# Patient Record
Sex: Male | Born: 1960 | Hispanic: Refuse to answer | Marital: Married | Smoking: Current some day smoker
Health system: Southern US, Community
[De-identification: ages and names within clinical notes are randomized; demographics above are authoritative.]

## PROBLEM LIST (undated history)

## (undated) DIAGNOSIS — I10 Essential (primary) hypertension: Secondary | ICD-10-CM

## (undated) HISTORY — PX: SKIN BIOPSY: SHX1

---

## 2017-02-16 ENCOUNTER — Emergency Department (HOSPITAL_COMMUNITY)
Admission: EM | Admit: 2017-02-16 | Discharge: 2017-02-16 | Disposition: A | Discharge status: Home / Self Care | Payer: BLUE CROSS/BLUE SHIELD | Attending: Emergency Medicine | Admitting: Emergency Medicine

## 2017-02-16 ENCOUNTER — Encounter (HOSPITAL_COMMUNITY): Payer: Self-pay | Admitting: Emergency Medicine

## 2017-02-16 ENCOUNTER — Emergency Department (HOSPITAL_COMMUNITY): Payer: BLUE CROSS/BLUE SHIELD

## 2017-02-16 DIAGNOSIS — R112 Nausea with vomiting, unspecified: Secondary | ICD-10-CM | POA: Insufficient documentation

## 2017-02-16 DIAGNOSIS — R6883 Chills (without fever): Secondary | ICD-10-CM | POA: Diagnosis not present

## 2017-02-16 DIAGNOSIS — R5383 Other fatigue: Secondary | ICD-10-CM | POA: Diagnosis not present

## 2017-02-16 DIAGNOSIS — R197 Diarrhea, unspecified: Secondary | ICD-10-CM | POA: Insufficient documentation

## 2017-02-16 DIAGNOSIS — Z79899 Other long term (current) drug therapy: Secondary | ICD-10-CM | POA: Diagnosis not present

## 2017-02-16 DIAGNOSIS — E860 Dehydration: Secondary | ICD-10-CM

## 2017-02-16 DIAGNOSIS — I10 Essential (primary) hypertension: Secondary | ICD-10-CM | POA: Insufficient documentation

## 2017-02-16 DIAGNOSIS — R42 Dizziness and giddiness: Secondary | ICD-10-CM | POA: Diagnosis not present

## 2017-02-16 DIAGNOSIS — R531 Weakness: Secondary | ICD-10-CM | POA: Diagnosis not present

## 2017-02-16 DIAGNOSIS — F17290 Nicotine dependence, other tobacco product, uncomplicated: Secondary | ICD-10-CM | POA: Insufficient documentation

## 2017-02-16 DIAGNOSIS — R61 Generalized hyperhidrosis: Secondary | ICD-10-CM | POA: Diagnosis not present

## 2017-02-16 HISTORY — DX: Essential (primary) hypertension: I10

## 2017-02-16 LAB — COMPREHENSIVE METABOLIC PANEL
ALBUMIN: 4.5 g/dL (ref 3.5–5.0)
ALT: 33 U/L (ref 17–63)
ANION GAP: 14 (ref 5–15)
AST: 64 U/L — ABNORMAL HIGH (ref 15–41)
Alkaline Phosphatase: 44 U/L (ref 38–126)
BILIRUBIN TOTAL: 1.4 mg/dL — AB (ref 0.3–1.2)
BUN: 15 mg/dL (ref 6–20)
CO2: 23 mmol/L (ref 22–32)
Calcium: 10 mg/dL (ref 8.9–10.3)
Chloride: 103 mmol/L (ref 101–111)
Creatinine, Ser: 1.56 mg/dL — ABNORMAL HIGH (ref 0.61–1.24)
GFR calc Af Amer: 56 mL/min — ABNORMAL LOW (ref >60)
GFR calc non Af Amer: 48 mL/min — ABNORMAL LOW (ref >60)
GLUCOSE: 137 mg/dL — AB (ref 65–99)
Potassium: 4 mmol/L (ref 3.5–5.1)
Sodium: 140 mmol/L (ref 135–145)
TOTAL PROTEIN: 7.7 g/dL (ref 6.5–8.1)

## 2017-02-16 LAB — I-STAT CG4 LACTIC ACID, ED
LACTIC ACID, VENOUS: 2.54 mmol/L — AB (ref 0.5–1.9)
Lactic Acid, Venous: 1.78 mmol/L (ref 0.5–1.9)

## 2017-02-16 LAB — URINALYSIS, ROUTINE W REFLEX MICROSCOPIC
BACTERIA UA: NONE SEEN
Squamous Epithelial / LPF: NONE SEEN

## 2017-02-16 LAB — CBC
HEMATOCRIT: 36.1 % — AB (ref 39.0–52.0)
HEMOGLOBIN: 12.1 g/dL — AB (ref 13.0–17.0)
MCH: 31.6 pg (ref 26.0–34.0)
MCHC: 33.5 g/dL (ref 30.0–36.0)
MCV: 94.3 fL (ref 78.0–100.0)
Platelets: 149 10*3/uL — ABNORMAL LOW (ref 150–400)
RBC: 3.83 MIL/uL — AB (ref 4.22–5.81)
RDW: 13 % (ref 11.5–15.5)
WBC: 5.3 10*3/uL (ref 4.0–10.5)

## 2017-02-16 LAB — ETHANOL: Alcohol, Ethyl (B): <5 mg/dL (ref <5)

## 2017-02-16 LAB — LIPASE, BLOOD: Lipase: 37 U/L (ref 11–51)

## 2017-02-16 LAB — I-STAT TROPONIN, ED: Troponin i, poc: 0 ng/mL (ref 0.00–0.08)

## 2017-02-16 MED ORDER — ONDANSETRON HCL 4 MG/2ML IJ SOLN
4.0000 mg | Freq: Once | INTRAMUSCULAR | Status: AC | PRN
  Administered 2017-02-16: 4 mg via INTRAVENOUS

## 2017-02-16 MED ORDER — IOPAMIDOL (ISOVUE-300) INJECTION 61%
INTRAVENOUS | Status: AC
  Filled 2017-02-16: qty 100

## 2017-02-16 MED ORDER — LORAZEPAM 2 MG/ML IJ SOLN
1.0000 mg | Freq: Once | INTRAMUSCULAR | Status: AC
  Administered 2017-02-16: 1 mg via INTRAVENOUS
  Filled 2017-02-16 (×2): qty 1

## 2017-02-16 MED ORDER — SODIUM CHLORIDE 0.9 % IV BOLUS (SEPSIS)
1000.0000 mL | Freq: Once | INTRAVENOUS | Status: AC
  Administered 2017-02-16: 1000 mL via INTRAVENOUS

## 2017-02-16 MED ORDER — IOPAMIDOL (ISOVUE-300) INJECTION 61%
INTRAVENOUS | Status: AC
  Administered 2017-02-16: 100 mL
  Filled 2017-02-16: qty 30

## 2017-02-16 MED ORDER — ONDANSETRON 4 MG PO TBDP: 4.0000 mg | ORAL_TABLET | Freq: Three times a day (TID) | ORAL | 0 refills | Status: AC | PRN

## 2017-02-16 MED ORDER — CEPHALEXIN 500 MG PO CAPS: 500.0000 mg | ORAL_CAPSULE | Freq: Three times a day (TID) | ORAL | 0 refills | Status: AC

## 2017-02-16 MED ORDER — HYDROCODONE-ACETAMINOPHEN 5-325 MG PO TABS: 1.0000 | ORAL_TABLET | Freq: Four times a day (QID) | ORAL | 0 refills | Status: AC | PRN

## 2017-02-16 MED ORDER — ONDANSETRON HCL 4 MG/2ML IJ SOLN
INTRAMUSCULAR | Status: AC
  Filled 2017-02-16: qty 2

## 2017-02-16 MED ORDER — ONDANSETRON HCL 4 MG/2ML IJ SOLN: 4.0000 mg | Freq: Once | INTRAMUSCULAR | Status: DC

## 2017-02-16 NOTE — ED Triage Notes (Addendum)
Pt from urologist following a prostate biopsy. Pt has had emesis, abdominal pain, and pain from the biopsy. Pt rates his pain at 10/10. Pt states he has been able to urinate following the biopsy and had a hematuria  (which he was told to expect). Pt has had 4 episodes of emesis following this. Pt had 25 mg phenergan IV. Pt states they did not give him pain medication

## 2017-02-16 NOTE — ED Notes (Signed)
Pt given ginger ale.

## 2017-02-16 NOTE — ED Provider Notes (Signed)
[facility]-EMERGENCY DEPT Provider Note   CSN: [id number] Arrival date & time: 02/16/17  1516     History   Chief Complaint Chief Complaint  Patient presents with  . Emesis    HPI Arthur Hardin is a 56 y.o. male.  HPI  56 yo M with PMHx HTN here with nausea, vomiting after prostate procedure. Pt actually states he awoke this AM feeling nauseous, lightheaded. He has not had anything to eat or drink. He thought this was due to nerves. He went to have a prostate bx in the office today with Dr. Wilson Singer when he began to feel nauseous, sweaty, and more lightheaded. The procedure was initially delayed, then completed and pt began to feel mildly worse. He had nausea, vomiting after the procedure that slowly improved so he was going to return home. While waiting for his car, he had recurrence of his nausea, vomiting with profuse sweating. No chest pain or abdominal pain. He subsequently was brought to ED for evaluation as he lives one hour away and was concerned about returning home. No fevers.   Past Medical History:  Diagnosis Date  . Hypertension     There are no active problems to display for this patient.   Past Surgical History:  Procedure Laterality Date  . SKIN BIOPSY         Home Medications    Prior to Admission medications   Medication Sig Start Date End Date Taking? Authorizing Provider  acetaminophen (TYLENOL) 500 MG tablet Take 1,000 mg by mouth every 6 (six) hours as needed.   Yes [provider]  nebivolol (BYSTOLIC) 10 MG tablet Take 20 mg by mouth daily.   Yes [provider]  cephALEXin (KEFLEX) 500 MG capsule Take 1 capsule (500 mg total) by mouth 3 (three) times daily. 02/16/17 02/21/17  Shaune Pollack, MD  HYDROcodone-acetaminophen (NORCO/VICODIN) 5-325 MG tablet Take 1-2 tablets by mouth every 6 (six) hours as needed for moderate pain or severe pain. 02/16/17   Shaune Pollack, MD  ondansetron (ZOFRAN ODT) 4 MG disintegrating tablet Take 1 tablet (4  mg total) by mouth every 8 (eight) hours as needed for nausea or vomiting. 02/16/17   Shaune Pollack, MD    Family History No family history on file.  Social History Social History  Substance Use Topics  . Smoking status: Current Some Day Smoker    Types: Cigars  . Smokeless tobacco: Never Used     Comment: rarely   . Alcohol use Yes     Comment: rarely      Allergies   Patient has no known allergies.   Review of Systems Review of Systems  Constitutional: Positive for chills and fatigue.  Gastrointestinal: Positive for nausea and vomiting.  Neurological: Positive for weakness and light-headedness.  All other systems reviewed and are negative.    Physical Exam Updated Vital Signs BP (!) 144/103 (BP Location: Right Arm)   Pulse (!) 106   Temp 98.4 F (36.9 C) (Oral)   Resp 18   Ht 5\' 8"  (1.727 m)   Wt 70.8 kg (156 lb)   SpO2 99%   BMI 23.72 kg/m   Physical Exam  Constitutional: He is oriented to person, place, and time. He appears well-developed and well-nourished. No distress.  HENT:  Head: Normocephalic and atraumatic.  Dry mucous membranes  Eyes: Conjunctivae are normal.  Neck: Neck supple.  Cardiovascular: Normal rate, regular rhythm and normal heart sounds.  Exam reveals no friction rub.   No murmur heard.  Pulmonary/Chest: Effort normal and breath sounds normal. No respiratory distress. He has no wheezes. He has no rales.  Abdominal: Soft. He exhibits no distension. There is no tenderness. There is no rebound and no guarding.  Hyperactive bowel sounds  Genitourinary:  Genitourinary Comments: No gross hematuria or bleeding from urethra.   Musculoskeletal: He exhibits no edema.  Neurological: He is alert and oriented to person, place, and time. He exhibits normal muscle tone.  Skin: Skin is warm. Capillary refill takes less than 2 seconds.  Psychiatric: He has a normal mood and affect.  Nursing note and vitals reviewed.    ED Treatments / Results    Labs (all labs ordered are listed, but only abnormal results are displayed) Labs Reviewed  COMPREHENSIVE METABOLIC PANEL - Abnormal; Notable for the following:       Result Value   Glucose, Bld 137 (*)    Creatinine, Ser 1.56 (*)    AST 64 (*)    Total Bilirubin 1.4 (*)    GFR calc non Af Amer 48 (*)    GFR calc Af Amer 56 (*)    All other components within normal limits  CBC - Abnormal; Notable for the following:    RBC 3.83 (*)    Hemoglobin 12.1 (*)    HCT 36.1 (*)    Platelets 149 (*)    All other components within normal limits  URINALYSIS, ROUTINE W REFLEX MICROSCOPIC - Abnormal; Notable for the following:    Color, Urine RED (*)    APPearance TURBID (*)    Glucose, UA   (*)    Value: TEST NOT REPORTED DUE TO COLOR INTERFERENCE OF URINE PIGMENT   Hgb urine dipstick   (*)    Value: TEST NOT REPORTED DUE TO COLOR INTERFERENCE OF URINE PIGMENT   Bilirubin Urine   (*)    Value: TEST NOT REPORTED DUE TO COLOR INTERFERENCE OF URINE PIGMENT   Ketones, ur   (*)    Value: TEST NOT REPORTED DUE TO COLOR INTERFERENCE OF URINE PIGMENT   Protein, ur   (*)    Value: TEST NOT REPORTED DUE TO COLOR INTERFERENCE OF URINE PIGMENT   Nitrite   (*)    Value: TEST NOT REPORTED DUE TO COLOR INTERFERENCE OF URINE PIGMENT   Leukocytes, UA   (*)    Value: TEST NOT REPORTED DUE TO COLOR INTERFERENCE OF URINE PIGMENT   All other components within normal limits  I-STAT CG4 LACTIC ACID, ED - Abnormal; Notable for the following:    Lactic Acid, Venous 2.54 (*)    All other components within normal limits  LIPASE, BLOOD  ETHANOL  I-STAT TROPOININ, ED  I-STAT CG4 LACTIC ACID, ED    EKG  EKG Interpretation  Date/Time:  Wednesday February 16 2017 17:01:22 EDT Ventricular Rate:  98 PR Interval:    QRS Duration: 89 QT Interval:  371 QTC Calculation: 474 R Axis:   79 Text Interpretation:  Sinus rhythm No old tracing to compare Confirmed by Shaune PollackIsaacs, Eilam [place] [phone]([id number]) on 02/17/2017 2:11:23 AM        Radiology Ct Abdomen Pelvis W Contrast  Result Date: 02/16/2017 CLINICAL DATA:  Emesis and abdominal pain from recent prostate biopsy EXAM: CT ABDOMEN AND PELVIS WITH CONTRAST TECHNIQUE: Multidetector CT imaging of the abdomen and pelvis was performed using the standard protocol following bolus administration of intravenous contrast. CONTRAST:  100mL ISOVUE-300 IOPAMIDOL (ISOVUE-300) INJECTION 61% COMPARISON:  None. FINDINGS: Lower chest: Lung bases demonstrate focal scarring and  bronchiectasis in the subpleural right lower lobe medially. Heart size is normal. Hepatobiliary: Hepatic steatosis. No calcified gallstones or biliary dilatation Pancreas: Unremarkable. No pancreatic ductal dilatation or surrounding inflammatory changes. Spleen: Normal in size without focal abnormality. Adrenals/Urinary Tract: Adrenal glands are normal. subcentimeter hypodense lesion left kidney too small to characterize. Punctate nonobstructing stone lower pole left kidney. Bladder unremarkable Stomach/Bowel: Stomach is within normal limits. Appendix appears normal. No evidence of bowel wall thickening, distention, or inflammatory changes. Vascular/Lymphatic: Aortic atherosclerosis. No enlarged abdominal or pelvic lymph nodes. Reproductive: Slightly enlarged prostate with mass effect on the bladder posteriorly. Other: Bilobate circumscribed fluid collections in the posterior pelvis without significant enhancement. These are in close proximity to the rectum. No free air. Musculoskeletal: Mixed sclerosis and lucency in the sub articular left femoral head suspicious for AVN. Small nonspecific lucencies in the left iliac bone. IMPRESSION: 1. No definite acute abnormalities are seen 2. Slightly unusual circumscribed fluid collections in the posterior pelvis, but without rim enhancement to suggest abscess. Findings could be secondary to post biopsy fluid or possible perirectal cysts. 3. Hepatic steatosis 4. 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 Electronically Signed   By: Jasmine Pang M.D.   On: 02/16/2017 21:07    Procedures Procedures (including critical care time)  Medications Ordered in ED Medications  ondansetron (ZOFRAN) injection 4 mg (4 mg Intravenous Given 02/16/17 1636)  sodium chloride 0.9 % bolus 1,000 mL (0 mLs Intravenous Stopped 02/16/17 1902)  sodium chloride 0.9 % bolus 1,000 mL (0 mLs Intravenous Stopped 02/16/17 1815)  iopamidol (ISOVUE-300) 61 % injection (100 mLs  Contrast Given 02/16/17 2026)  LORazepam (ATIVAN) injection 1 mg (1 mg Intravenous Given 02/16/17 2230)     Initial Impression / Assessment and Plan / ED Course  I have reviewed the triage vital signs and the nursing notes.  Pertinent labs & imaging results that were available during my care of the patient were reviewed by me and considered in my medical decision making (see chart for details).    56 yo M with PMHx EtOH dependence here with nausea, vomiting in setting of recent prostate bx. I suspect pt likely has viral GI illness, food-borne illness, versus diarrhea/gastritis 2/2 recent heavy EtOH use, as his sx started before prostate biopsy. I suspect his sx worsened in setting of prostate bx but are not directly related. Lab work shows significant dehydration with mild LA elevation, which I suspect is 2/2 his vomiting and recent EtOH use, and he has no fever, normal WBC, and no evidence to suggest infectious etiology. AST:ALT elevated in pattern c/w chronic EtOH use. CT scan obtained given his recent biopsy and lactic elevation and fortunately shows no acute complications or other abnormalities. His orthostatics and LA have markedly improved after fluids and his sx are now resolved and he is feeling well, tolerating liquid and solid PO without difficulty. He is hypertensive which I suspect is his baseline (he admits to being>180/100 at home), though he may have some component of early, mild EtOH w/d. Pt given ativan with improvement, and  feels safe/stable to return home. I discussed diagnosis, labs, with pt in detail and he feels comfortable with plan. He has some mild pyuria which is likely inflammation s/p bx, but given his initial presentation and recent invasive procedure will place on keflex, d/c home with supportive care.  Final Clinical Impressions(s) / ED Diagnoses   Final diagnoses:  Nausea vomiting and diarrhea  Dehydration    New Prescriptions Discharge Medication List  as of 02/16/2017 10:26 PM    START taking these medications   Details  cephALEXin (KEFLEX) 500 MG capsule Take 1 capsule (500 mg total) by mouth 3 (three) times daily., Starting Wed 02/16/2017, Until Mon 02/21/2017, Print    HYDROcodone-acetaminophen (NORCO/VICODIN) 5-325 MG tablet Take 1-2 tablets by mouth every 6 (six) hours as needed for moderate pain or severe pain., Starting Wed 02/16/2017, Print    ondansetron (ZOFRAN ODT) 4 MG disintegrating tablet Take 1 tablet (4 mg total) by mouth every 8 (eight) hours as needed for nausea or vomiting., Starting Wed 02/16/2017, Print         Shaune Pollack, MD 02/17/17 [phone]

## 2017-02-16 NOTE — ED Notes (Signed)
Pt able to keep ginger ale down 

## 2017-03-07 ENCOUNTER — Encounter: Payer: Self-pay | Admitting: Radiation Oncology

## 2017-03-21 ENCOUNTER — Ambulatory Visit: Payer: BLUE CROSS/BLUE SHIELD | Admitting: Radiation Oncology

## 2017-03-21 ENCOUNTER — Ambulatory Visit: Payer: BLUE CROSS/BLUE SHIELD

## 2019-01-01 IMAGING — CT CT ABD-PELV W/ CM
2 of 5 series · 16 of 46 positions shown, 18 images · IV contrast (ISOVUE)
Comparison: None.

CLINICAL DATA: Emesis and abdominal pain from recent prostate
biopsy

EXAM:
CT ABDOMEN AND PELVIS WITH CONTRAST
TECHNIQUE: Multidetector CT imaging of the abdomen and pelvis was performed
using the standard protocol following bolus administration of
intravenous contrast.
CONTRAST:  100mL 0QCWIY-0ZZ IOPAMIDOL (0QCWIY-0ZZ) INJECTION 61%

[Series 2: abd/pel with · axial · 0.74mm/px · z∈[-448,-43]mm · 13 of 95 slices shown, 15 images]
[im 7/95  soft-tissue]
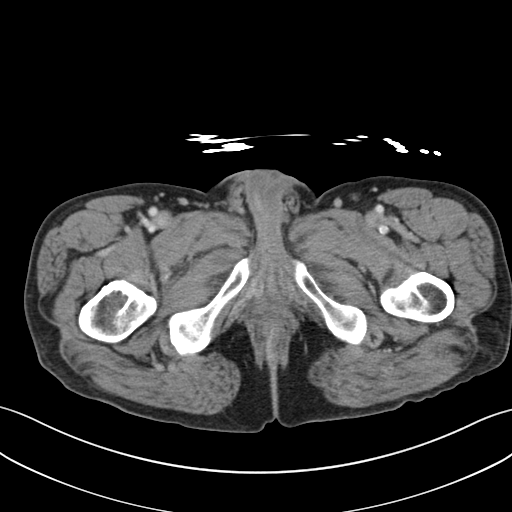
[im 7/95  bone]
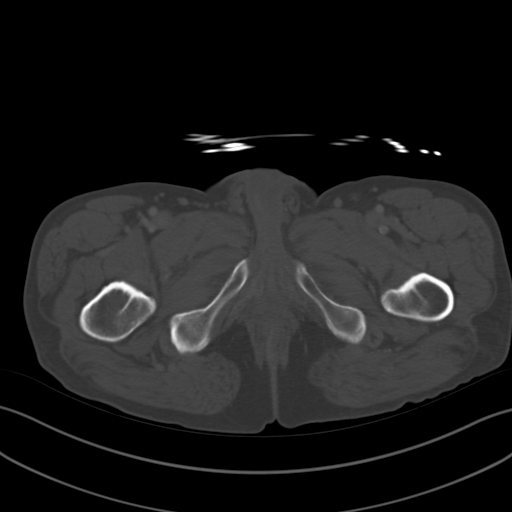
[im 14/95  soft-tissue]
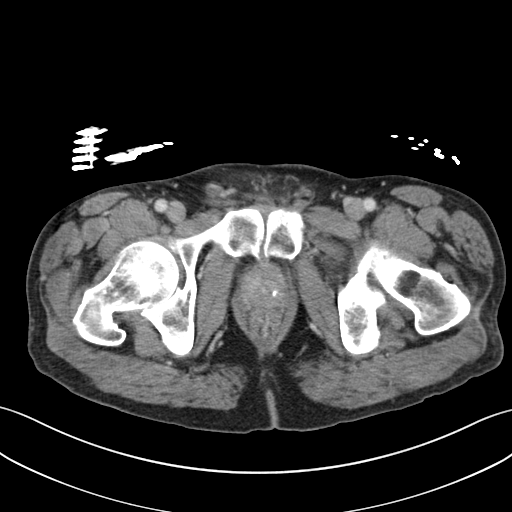
[im 21/95  soft-tissue]
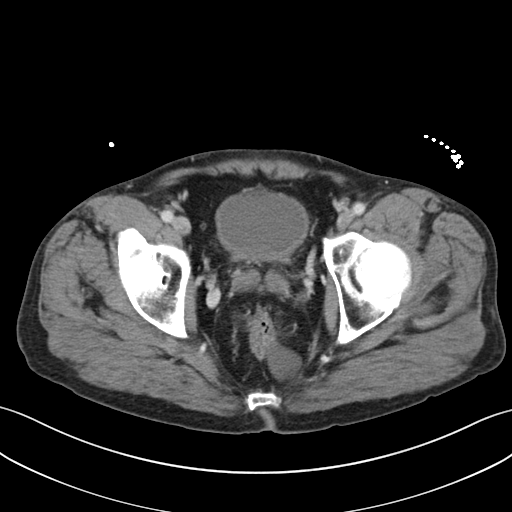
[im 27/95  soft-tissue]
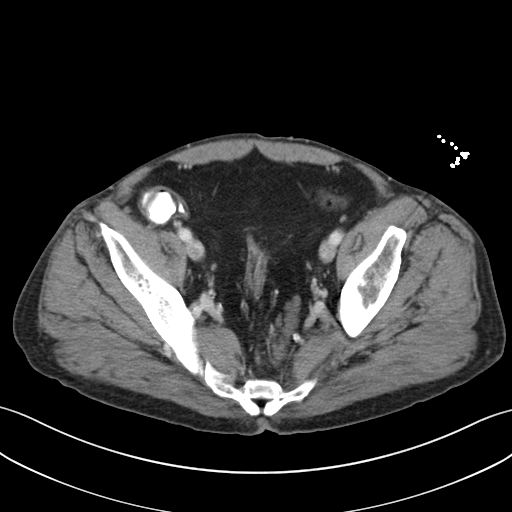
[im 34/95  soft-tissue]
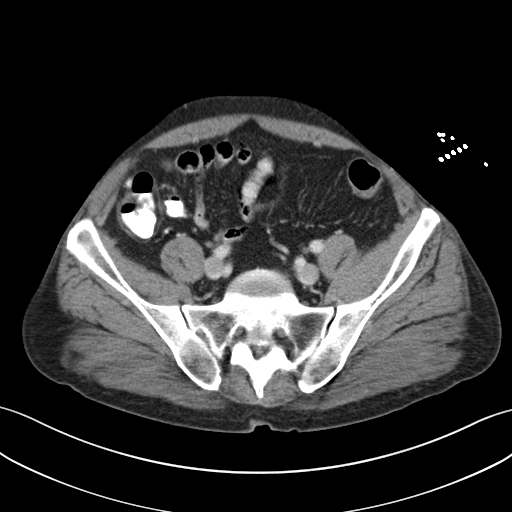
[im 41/95  soft-tissue]
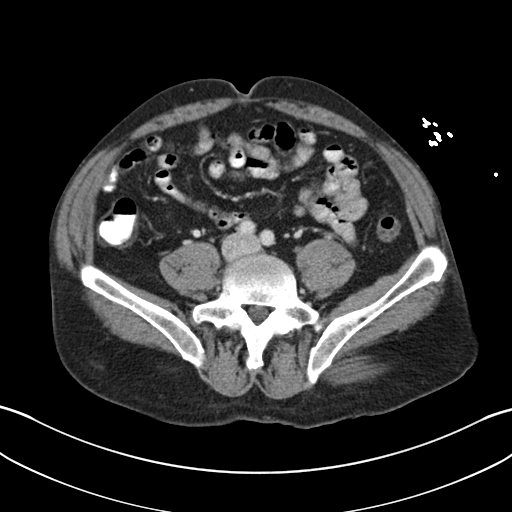
[im 48/95  soft-tissue]
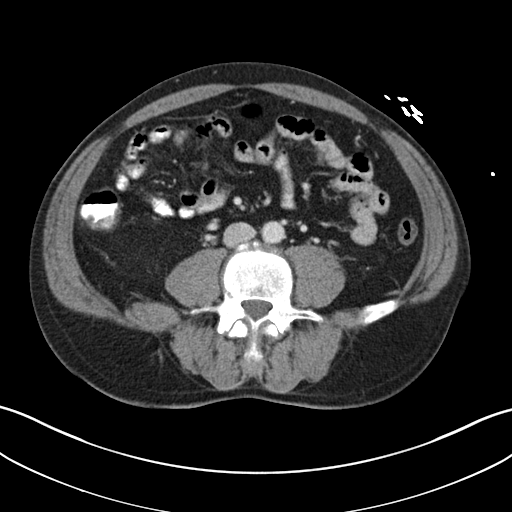
[im 54/95  soft-tissue]
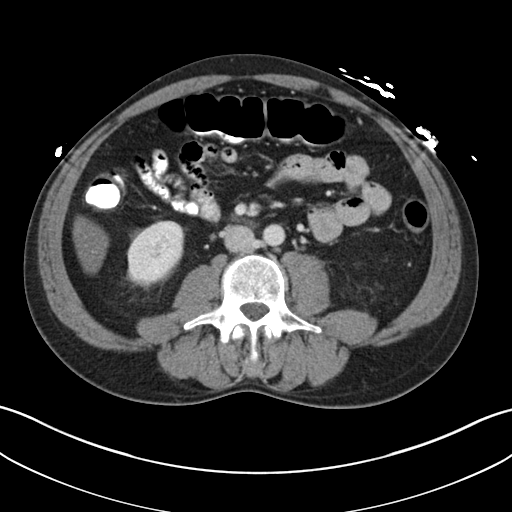
[im 61/95  soft-tissue]
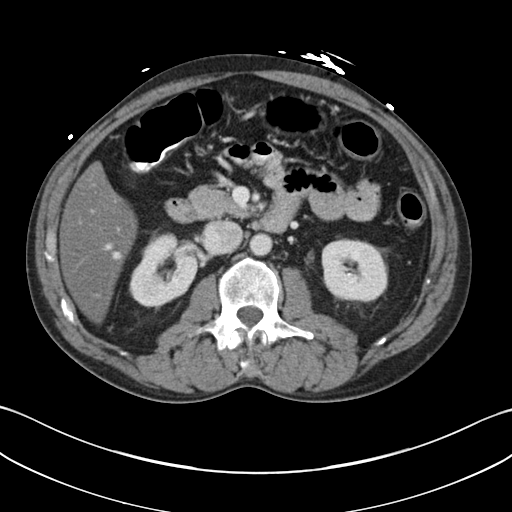
[im 61/95  bone]
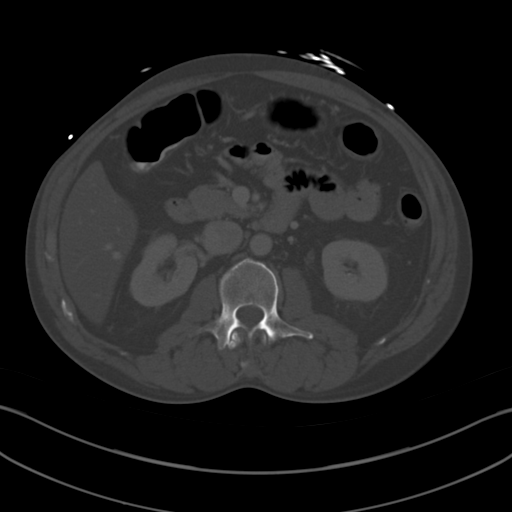
[im 68/95  soft-tissue]
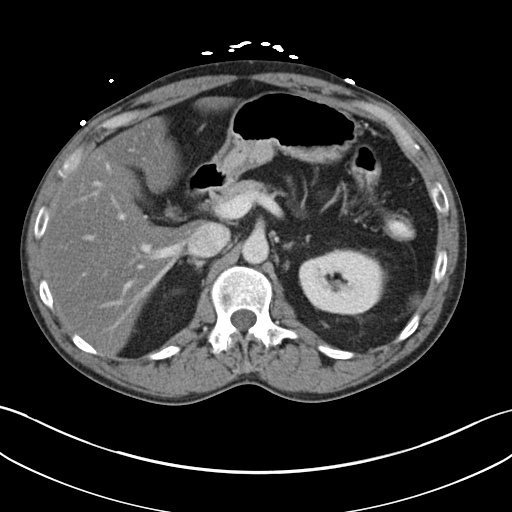
[im 74/95  soft-tissue]
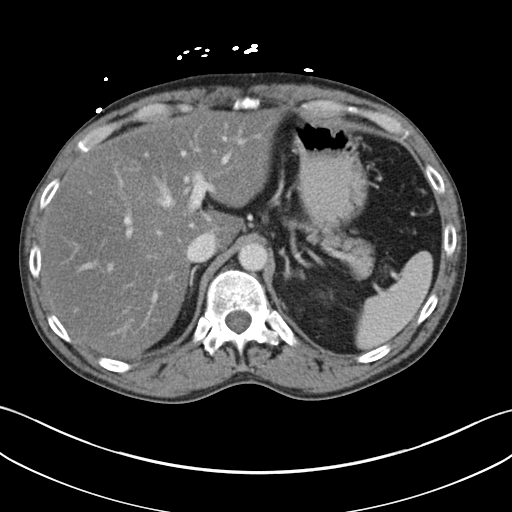
[im 81/95  soft-tissue]
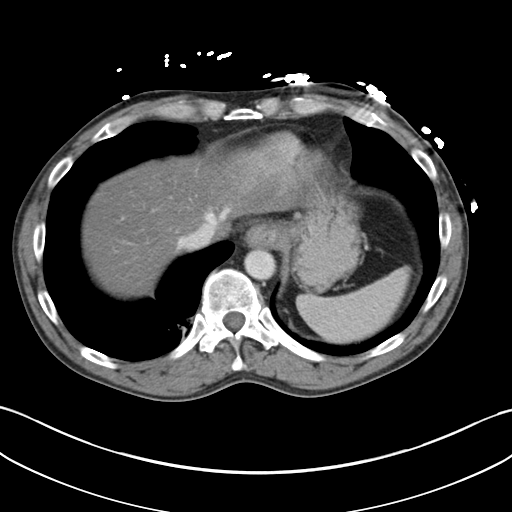
[im 88/95  soft-tissue]
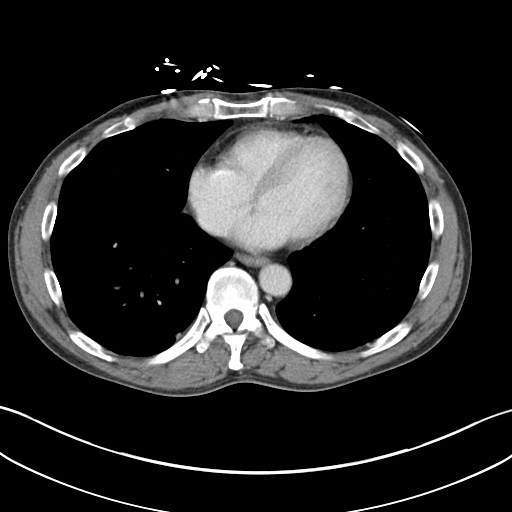

[Series 6: coronal a/|p · coronal · 0.73mm/px · 3 of 134 slices shown]
[im 45/134  soft-tissue]
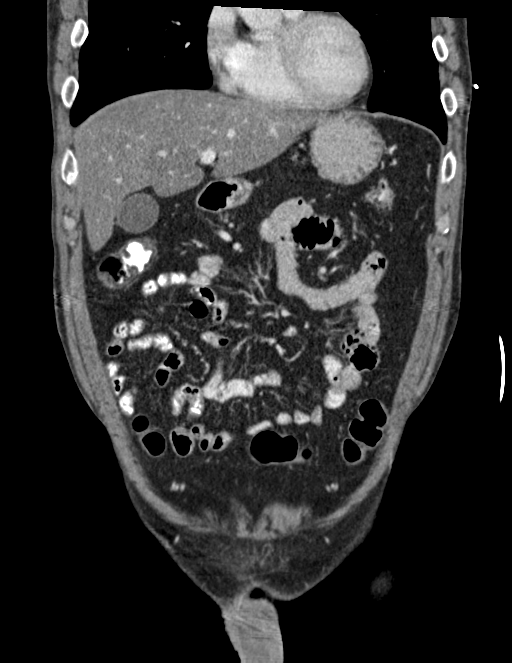
[im 60/134  soft-tissue]
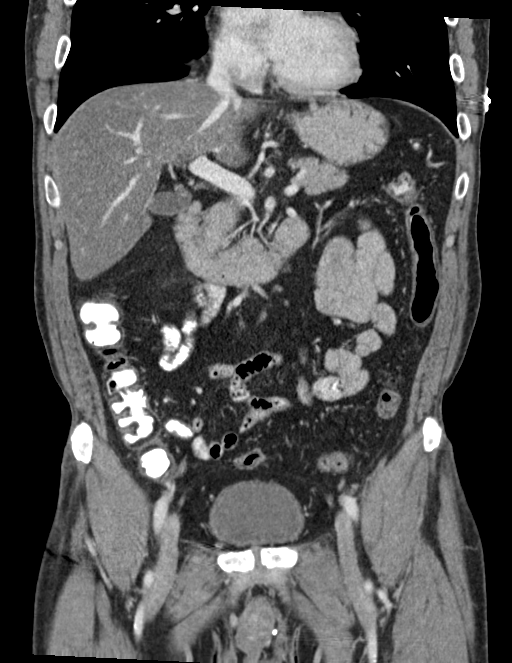
[im 74/134  soft-tissue]
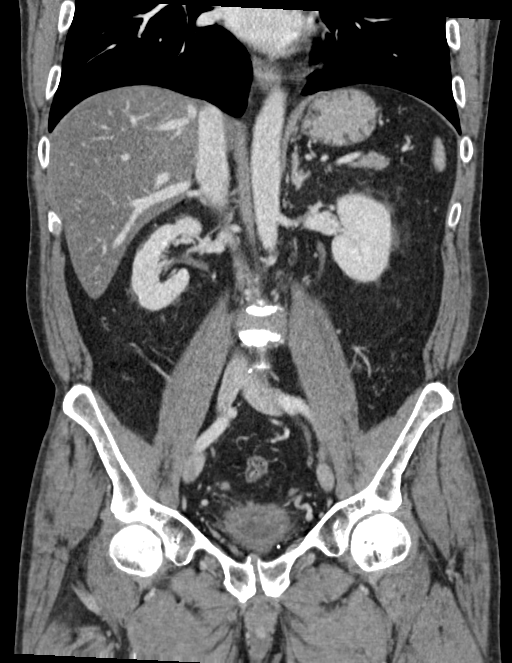

[16 of 46 positions shown; findings below may reference images not displayed]

FINDINGS: Lower chest: Lung bases demonstrate focal scarring and
bronchiectasis in the subpleural right lower lobe medially. Heart
size is normal.

Hepatobiliary: Hepatic steatosis. No calcified gallstones or biliary
dilatation

Pancreas: Unremarkable. No pancreatic ductal dilatation or
surrounding inflammatory changes.

Spleen: Normal in size without focal abnormality.

Adrenals/Urinary Tract: Adrenal glands are normal. subcentimeter
hypodense lesion left kidney too small to characterize. Punctate
nonobstructing stone lower pole left kidney. Bladder unremarkable

Stomach/Bowel: Stomach is within normal limits. Appendix appears
normal. No evidence of bowel wall thickening, distention, or
inflammatory changes.

Vascular/Lymphatic: Aortic atherosclerosis. No enlarged abdominal or
pelvic lymph nodes.

Reproductive: Slightly enlarged prostate with mass effect on the
bladder posteriorly.

Other: Bilobate circumscribed fluid collections in the posterior
pelvis without significant enhancement. These are in close proximity
to the rectum. No free air.

Musculoskeletal: Mixed sclerosis and lucency in the sub articular
left femoral head suspicious for AVN. Small nonspecific lucencies in
the left iliac bone.
IMPRESSION: 1. No definite acute abnormalities are seen
2. Slightly unusual circumscribed fluid collections in the posterior
pelvis, but without rim enhancement to suggest abscess. Findings
could be secondary to post biopsy fluid or possible perirectal
cysts.
3. Hepatic steatosis
4. Probable AVN in the left femoral head.
